# Patient Record
Sex: Male | Born: 2000 | Race: White | Hispanic: No | Marital: Single | Smoking: Never smoker
Health system: Southern US, Community
[De-identification: ages and names within clinical notes are randomized; demographics above are authoritative.]

---
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
## 2022-07-01 IMAGING — CR DG LUMBAR SPINE 2-3V
1 series · 3 of 3 positions shown · non-contrast
Comparison: None.

CLINICAL DATA: Lumbosacral back pain.

EXAM:
LUMBAR SPINE - 2-3 VIEW

[Series 1: dg lumbar spine 2-3 views · 0.14mm/px · 3 of 3 slices shown]
[im 1/3]
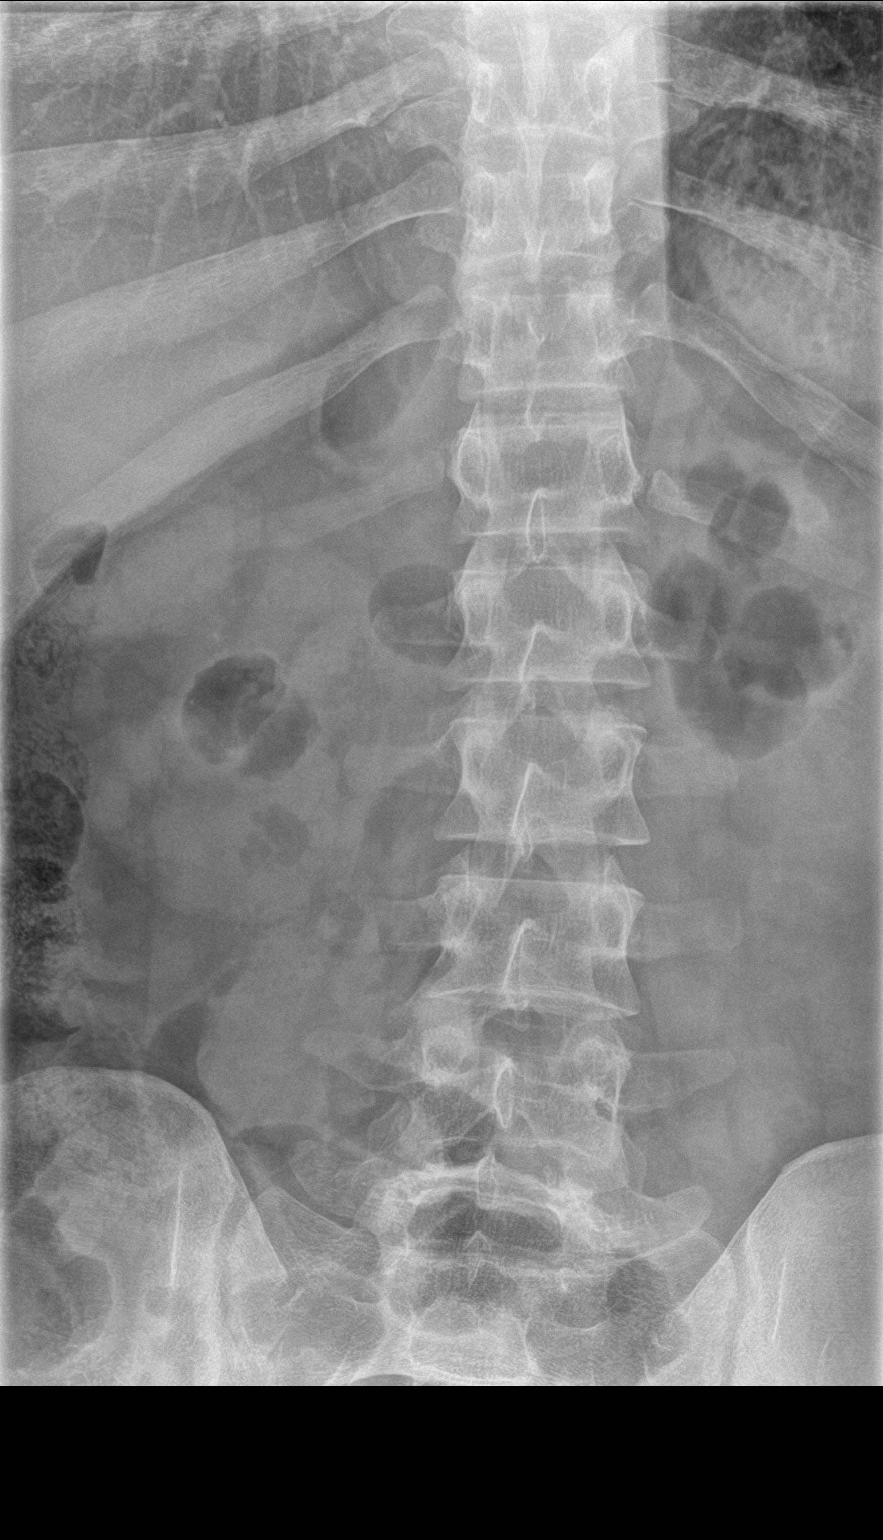
[im 2/3]
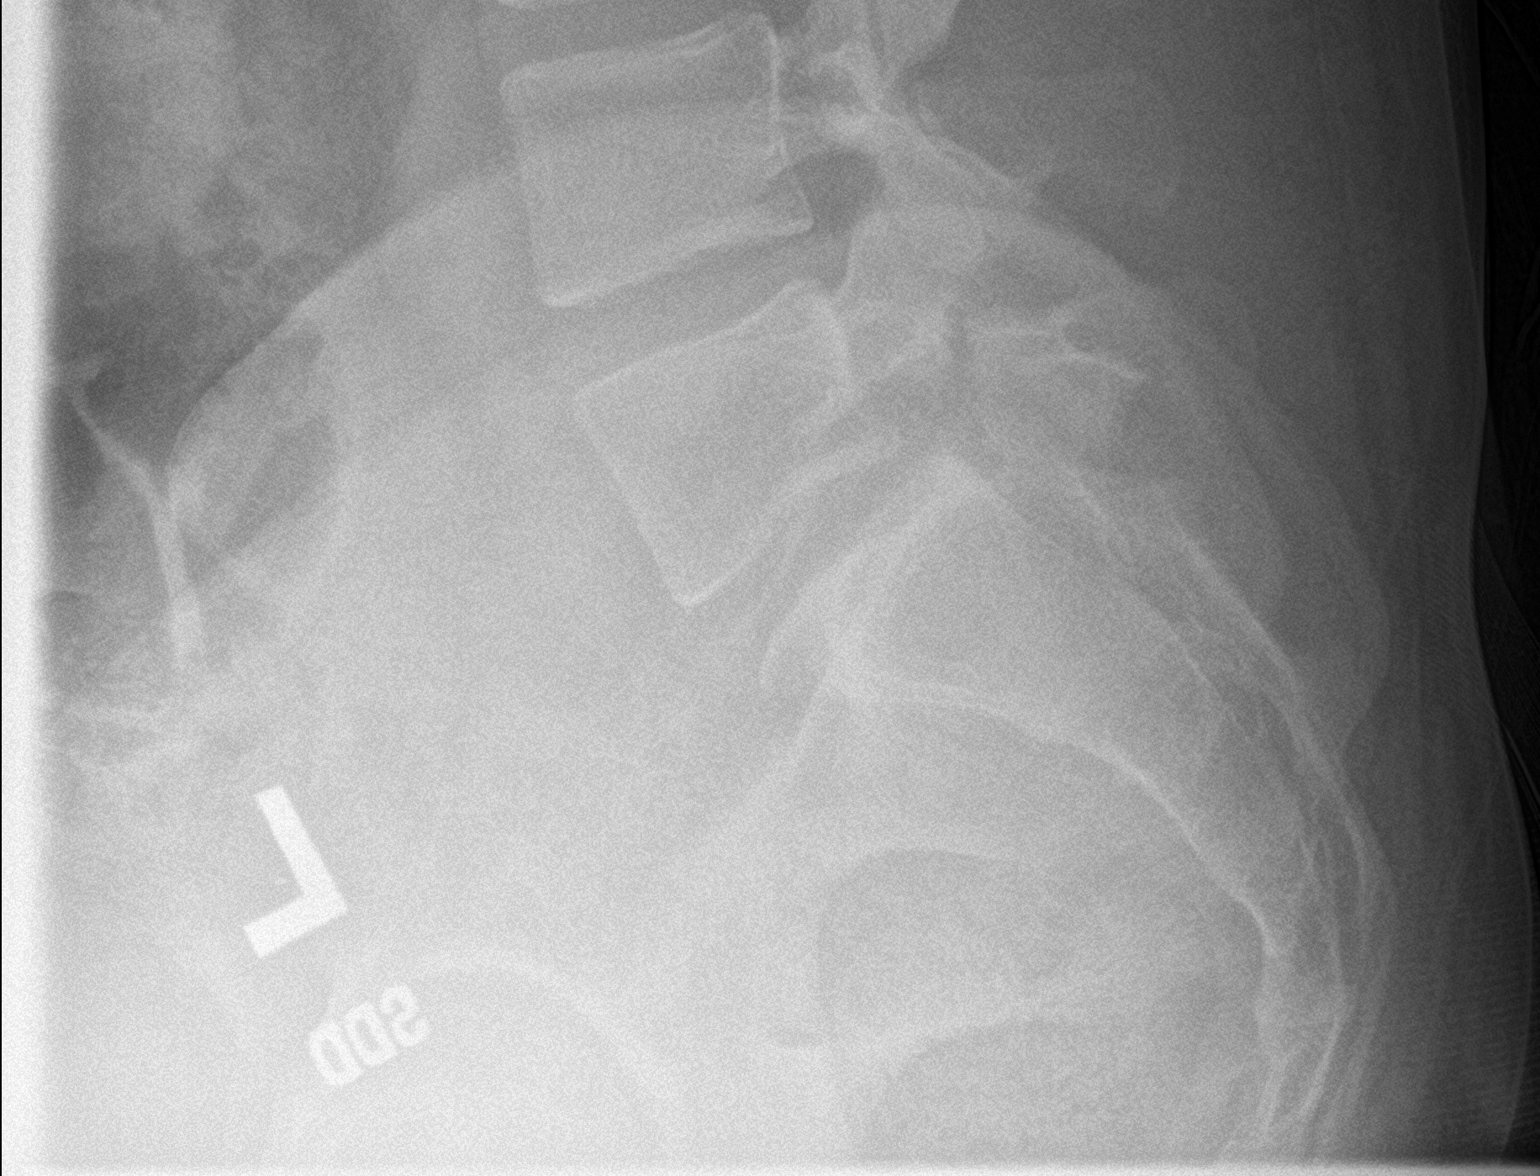
[im 3/3]
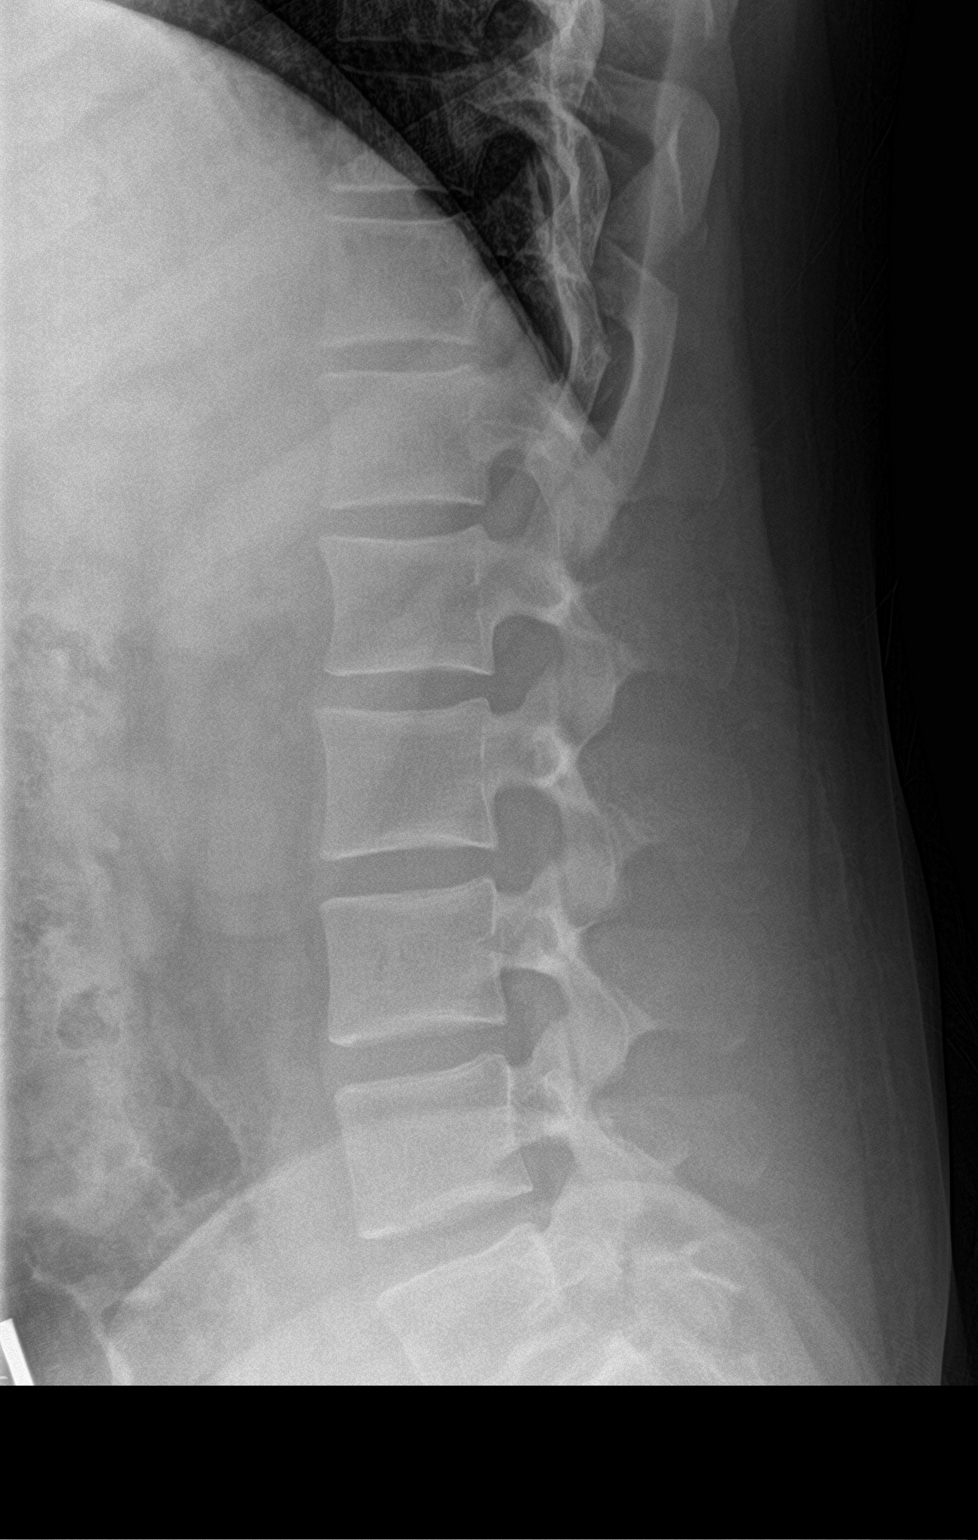

[3 of 3 positions shown; findings below may reference images not displayed]

FINDINGS: There are 5 non-rib-bearing lumbar vertebra. The alignment is
maintained. Vertebral body heights are normal. There is no
listhesis. Bilateral L5 pars interarticularis defects. Disc spaces
are preserved. No fracture. Sacroiliac joints are symmetric and
normal.
IMPRESSION: 1. No acute findings.
2. Bilateral L5 pars interarticularis defects without listhesis.
